# Patient Record
Sex: Female | Born: 1985 | Race: Black or African American | Hispanic: No | Marital: Single | Smoking: Former smoker
Health system: Southern US, Community
[De-identification: ages and names within clinical notes are randomized; demographics above are authoritative.]

## PROBLEM LIST (undated history)

## (undated) DIAGNOSIS — W3400XA Accidental discharge from unspecified firearms or gun, initial encounter: Secondary | ICD-10-CM

## (undated) HISTORY — PX: ABDOMINAL SURGERY: SHX537

---

## 2012-03-22 DIAGNOSIS — Z87828 Personal history of other (healed) physical injury and trauma: Secondary | ICD-10-CM

## 2012-03-22 HISTORY — DX: Personal history of other (healed) physical injury and trauma: Z87.828

## 2012-03-22 HISTORY — PX: EXPLORATORY LAPAROTOMY: SUR591

## 2015-12-03 ENCOUNTER — Emergency Department: Payer: Self-pay

## 2015-12-03 ENCOUNTER — Encounter: Payer: Self-pay | Admitting: Intensive Care

## 2015-12-03 ENCOUNTER — Emergency Department
Admission: EM | Admit: 2015-12-03 | Discharge: 2015-12-03 | Disposition: A | Discharge status: Home / Self Care | Payer: Self-pay | Attending: Student | Admitting: Student

## 2015-12-03 DIAGNOSIS — M25511 Pain in right shoulder: Secondary | ICD-10-CM | POA: Insufficient documentation

## 2015-12-03 DIAGNOSIS — Y929 Unspecified place or not applicable: Secondary | ICD-10-CM | POA: Insufficient documentation

## 2015-12-03 DIAGNOSIS — S161XXA Strain of muscle, fascia and tendon at neck level, initial encounter: Secondary | ICD-10-CM | POA: Insufficient documentation

## 2015-12-03 DIAGNOSIS — Y9389 Activity, other specified: Secondary | ICD-10-CM | POA: Insufficient documentation

## 2015-12-03 DIAGNOSIS — Y990 Civilian activity done for income or pay: Secondary | ICD-10-CM | POA: Insufficient documentation

## 2015-12-03 DIAGNOSIS — X58XXXA Exposure to other specified factors, initial encounter: Secondary | ICD-10-CM | POA: Insufficient documentation

## 2015-12-03 DIAGNOSIS — R0789 Other chest pain: Secondary | ICD-10-CM | POA: Insufficient documentation

## 2015-12-03 MED ORDER — METHOCARBAMOL 750 MG PO TABS: 750.0000 mg | ORAL_TABLET | Freq: Four times a day (QID) | ORAL | 0 refills | Status: DC

## 2015-12-03 MED ORDER — TRAMADOL HCL 50 MG PO TABS: 50.0000 mg | ORAL_TABLET | Freq: Four times a day (QID) | ORAL | 0 refills | Status: AC | PRN

## 2015-12-03 MED ORDER — NAPROXEN 500 MG PO TBEC: 500.0000 mg | DELAYED_RELEASE_TABLET | Freq: Two times a day (BID) | ORAL | 2 refills | Status: AC
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
## 2015-12-03 NOTE — ED Notes (Signed)
Patient's discharge and follow up information reviewed with patient by ED nursing staff and patient given the opportunity to ask questions pertaining to ED visit and discharge plan of care. Patient advised that should symptoms not continue to improve, resolve entirely, or should new symptoms develop then a follow up visit with their PCP or a return visit to the ED may be warranted. Patient verbalized consent and understanding of discharge plan of care including potential need for further evaluation. Patient discharged in stable condition per attending ED physician on duty.   Pt given info sheet for medication management clinic.
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
## 2016-10-07 ENCOUNTER — Encounter (HOSPITAL_COMMUNITY): Payer: Self-pay | Admitting: Emergency Medicine

## 2016-10-07 ENCOUNTER — Emergency Department (HOSPITAL_COMMUNITY)
Admission: EM | Admit: 2016-10-07 | Discharge: 2016-10-07 | Disposition: A | Discharge status: Home / Self Care | Payer: Self-pay | Attending: Emergency Medicine | Admitting: Emergency Medicine

## 2016-10-07 DIAGNOSIS — K0889 Other specified disorders of teeth and supporting structures: Secondary | ICD-10-CM

## 2016-10-07 DIAGNOSIS — S025XXA Fracture of tooth (traumatic), initial encounter for closed fracture: Secondary | ICD-10-CM | POA: Insufficient documentation

## 2016-10-07 DIAGNOSIS — Y939 Activity, unspecified: Secondary | ICD-10-CM | POA: Insufficient documentation

## 2016-10-07 DIAGNOSIS — X58XXXA Exposure to other specified factors, initial encounter: Secondary | ICD-10-CM | POA: Insufficient documentation

## 2016-10-07 DIAGNOSIS — Y929 Unspecified place or not applicable: Secondary | ICD-10-CM | POA: Insufficient documentation

## 2016-10-07 DIAGNOSIS — Z79899 Other long term (current) drug therapy: Secondary | ICD-10-CM | POA: Insufficient documentation

## 2016-10-07 DIAGNOSIS — Y999 Unspecified external cause status: Secondary | ICD-10-CM | POA: Insufficient documentation

## 2016-10-07 MED ORDER — PENICILLIN V POTASSIUM 500 MG PO TABS: 500.0000 mg | ORAL_TABLET | Freq: Three times a day (TID) | ORAL | 0 refills | Status: DC

## 2016-10-07 MED ORDER — HYDROCODONE-ACETAMINOPHEN 5-325 MG PO TABS: 1.0000 | ORAL_TABLET | ORAL | 0 refills | Status: DC | PRN

## 2016-10-07 MED ORDER — HYDROCODONE-ACETAMINOPHEN 5-325 MG PO TABS
1.0000 | ORAL_TABLET | Freq: Once | ORAL | Status: AC
  Administered 2016-10-07: 1 via ORAL
  Filled 2016-10-07: qty 1

## 2016-10-07 MED ORDER — PENICILLIN V POTASSIUM 500 MG PO TABS
500.0000 mg | ORAL_TABLET | Freq: Once | ORAL | Status: AC
  Administered 2016-10-07: 500 mg via ORAL
  Filled 2016-10-07: qty 1

## 2016-10-07 MED ORDER — BUPIVACAINE-EPINEPHRINE (PF) 0.5% -1:200000 IJ SOLN
1.8000 mL | Freq: Once | INTRAMUSCULAR | Status: AC
  Administered 2016-10-07: 1.8 mL
  Filled 2016-10-07: qty 1.8

## 2016-10-07 NOTE — ED Notes (Signed)
Pt states that she has been having pain to right lower tooth x 2 days and has progressively gotten worse. Molar appears broke. Pt given heating pad for comfort.

## 2016-10-07 NOTE — ED Triage Notes (Signed)
Pt from home with c/o lower right dental pain x 3 days. Pt denies fever or chills. Pt states she has not made an appointment with her dentist, but intends to. Pt states pain got increasingly worse this evening.

## 2016-10-07 NOTE — Discharge Instructions (Signed)
Follow up with your dentist for further management of dental pain. °
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
## 2017-04-14 ENCOUNTER — Emergency Department (HOSPITAL_COMMUNITY): Payer: Self-pay

## 2017-04-14 ENCOUNTER — Encounter (HOSPITAL_COMMUNITY): Payer: Self-pay | Admitting: Emergency Medicine

## 2017-04-14 ENCOUNTER — Other Ambulatory Visit: Payer: Self-pay

## 2017-04-14 ENCOUNTER — Emergency Department (HOSPITAL_COMMUNITY)
Admission: EM | Admit: 2017-04-14 | Discharge: 2017-04-14 | Disposition: A | Discharge status: Home / Self Care | Payer: Self-pay | Attending: Emergency Medicine | Admitting: Emergency Medicine

## 2017-04-14 DIAGNOSIS — R110 Nausea: Secondary | ICD-10-CM | POA: Insufficient documentation

## 2017-04-14 DIAGNOSIS — B9789 Other viral agents as the cause of diseases classified elsewhere: Secondary | ICD-10-CM | POA: Insufficient documentation

## 2017-04-14 DIAGNOSIS — J069 Acute upper respiratory infection, unspecified: Secondary | ICD-10-CM

## 2017-04-14 HISTORY — DX: Accidental discharge from unspecified firearms or gun, initial encounter: W34.00XA

## 2017-04-14 MED ORDER — BENZONATATE 100 MG PO CAPS: 200.0000 mg | ORAL_CAPSULE | Freq: Three times a day (TID) | ORAL | 0 refills | Status: DC

## 2017-04-14 MED ORDER — FLUTICASONE PROPIONATE 50 MCG/ACT NA SUSP: 1.0000 | Freq: Every day | NASAL | 2 refills | Status: DC

## 2017-04-14 MED ORDER — ONDANSETRON 4 MG PO TBDP: 4.0000 mg | ORAL_TABLET | Freq: Three times a day (TID) | ORAL | 0 refills | Status: DC | PRN
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
## 2017-04-14 NOTE — Discharge Instructions (Signed)
Please read attached information regarding your condition. Take Tessalon Perles as needed for cough. Take Zofran as needed for nausea. Use Flonase as needed for nasal congestion. Take Tylenol as needed for body aches. Return to ED for worsening symptoms, chest pain, trouble breathing, wheezing, increase productive cough or trauma to the chest.

## 2017-06-14 ENCOUNTER — Emergency Department (HOSPITAL_COMMUNITY)
Admission: EM | Admit: 2017-06-14 | Discharge: 2017-06-15 | Disposition: A | Discharge status: Home / Self Care | Payer: Self-pay | Attending: Emergency Medicine | Admitting: Emergency Medicine

## 2017-06-14 ENCOUNTER — Encounter (HOSPITAL_COMMUNITY): Payer: Self-pay

## 2017-06-14 ENCOUNTER — Emergency Department (HOSPITAL_COMMUNITY): Payer: Self-pay

## 2017-06-14 ENCOUNTER — Other Ambulatory Visit: Payer: Self-pay

## 2017-06-14 DIAGNOSIS — R112 Nausea with vomiting, unspecified: Secondary | ICD-10-CM | POA: Insufficient documentation

## 2017-06-14 DIAGNOSIS — R197 Diarrhea, unspecified: Secondary | ICD-10-CM | POA: Insufficient documentation

## 2017-06-14 DIAGNOSIS — R1084 Generalized abdominal pain: Secondary | ICD-10-CM | POA: Insufficient documentation

## 2017-06-14 LAB — COMPREHENSIVE METABOLIC PANEL
ALT: 20 U/L (ref 14–54)
ANION GAP: 9 (ref 5–15)
AST: 21 U/L (ref 15–41)
Albumin: 4.5 g/dL (ref 3.5–5.0)
Alkaline Phosphatase: 40 U/L (ref 38–126)
BUN: 11 mg/dL (ref 6–20)
CHLORIDE: 106 mmol/L (ref 101–111)
CO2: 24 mmol/L (ref 22–32)
Calcium: 9.4 mg/dL (ref 8.9–10.3)
Creatinine, Ser: 0.73 mg/dL (ref 0.44–1.00)
GFR calc Af Amer: >60 mL/min (ref >60)
GFR calc non Af Amer: >60 mL/min (ref >60)
Glucose, Bld: 93 mg/dL (ref 65–99)
POTASSIUM: 3.8 mmol/L (ref 3.5–5.1)
SODIUM: 139 mmol/L (ref 135–145)
Total Bilirubin: 1.3 mg/dL — ABNORMAL HIGH (ref 0.3–1.2)
Total Protein: 8.2 g/dL — ABNORMAL HIGH (ref 6.5–8.1)

## 2017-06-14 LAB — URINALYSIS, ROUTINE W REFLEX MICROSCOPIC
BILIRUBIN URINE: NEGATIVE
Bacteria, UA: NONE SEEN
Glucose, UA: NEGATIVE mg/dL
HGB URINE DIPSTICK: NEGATIVE
KETONES UR: 80 mg/dL — AB
LEUKOCYTES UA: NEGATIVE
Nitrite: NEGATIVE
PH: 5 (ref 5.0–8.0)
Protein, ur: 30 mg/dL — AB
Specific Gravity, Urine: 1.031 — ABNORMAL HIGH (ref 1.005–1.030)

## 2017-06-14 LAB — CBC
HCT: 43.4 % (ref 36.0–46.0)
HEMOGLOBIN: 14.8 g/dL (ref 12.0–15.0)
MCH: 31.3 pg (ref 26.0–34.0)
MCHC: 34.1 g/dL (ref 30.0–36.0)
MCV: 91.8 fL (ref 78.0–100.0)
Platelets: 314 10*3/uL (ref 150–400)
RBC: 4.73 MIL/uL (ref 3.87–5.11)
RDW: 12.6 % (ref 11.5–15.5)
WBC: 12.6 10*3/uL — AB (ref 4.0–10.5)

## 2017-06-14 LAB — LIPASE, BLOOD: LIPASE: 27 U/L (ref 11–51)

## 2017-06-14 LAB — I-STAT BETA HCG BLOOD, ED (MC, WL, AP ONLY)

## 2017-06-14 MED ORDER — SODIUM CHLORIDE 0.9 % IV BOLUS
1000.0000 mL | Freq: Once | INTRAVENOUS | Status: AC
  Administered 2017-06-14: 1000 mL via INTRAVENOUS

## 2017-06-14 MED ORDER — MORPHINE SULFATE (PF) 4 MG/ML IV SOLN
4.0000 mg | Freq: Once | INTRAVENOUS | Status: AC
  Administered 2017-06-14: 4 mg via INTRAVENOUS
  Filled 2017-06-14: qty 1

## 2017-06-14 MED ORDER — ONDANSETRON 4 MG PO TBDP
4.0000 mg | ORAL_TABLET | Freq: Once | ORAL | Status: AC | PRN
  Administered 2017-06-14: 4 mg via ORAL
  Filled 2017-06-14: qty 1

## 2017-06-14 MED ORDER — IOPAMIDOL (ISOVUE-300) INJECTION 61%
INTRAVENOUS | Status: AC
  Administered 2017-06-14: 100 mL
  Filled 2017-06-14: qty 100

## 2017-06-14 MED ORDER — ONDANSETRON 4 MG PO TBDP: 4.0000 mg | ORAL_TABLET | Freq: Three times a day (TID) | ORAL | 0 refills | Status: DC | PRN

## 2017-06-14 NOTE — ED Provider Notes (Signed)
Received patient transferred care from Harolyn RutherfordShawn Joy, PA-C  Pending CT abdomen and pelvis r/o appy. Patient was discharged by shawn after Ct results and confirmed improvement in symptoms.   Georgiana ShoreMitchell, Zaley Talley B, PA-C 06/15/17 Salley [id number]    Shaune PollackIsaacs, Cameron, MD 06/15/17 1210

## 2017-06-14 NOTE — ED Triage Notes (Signed)
Pt c/o emesis, diarrhea starting last night. Also c/o sharp intermittent abd pain that is relieved somewhat after throwing up, but comes back. Speakign in full sentences in triage.

## 2017-06-14 NOTE — ED Notes (Signed)
Patient transported to CT 

## 2017-06-14 NOTE — Discharge Instructions (Addendum)
Your symptoms are consistent with a viral illness. Viruses do not require antibiotics. Treatment is symptomatic care.   Hand washing: Wash your hands throughout the day, but especially before and after touching the face, using the restroom, sneezing, coughing, or touching surfaces that have been coughed or sneezed upon. Hydration: Symptoms will be intensified and complicated by dehydration. Dehydration can also extend the duration of symptoms. Drink plenty of fluids and get plenty of rest. You should be drinking at least half a liter of water an hour to stay hydrated. Electrolyte drinks (ex. Gatorade, Powerade, Pedialyte) are also encouraged. You should be drinking enough fluids to make your urine light yellow, almost clear. If this is not the case, you are not drinking enough water. Please note that some of the treatments indicated below will not be effective if you are not adequately hydrated. Diet: Please concentrate on hydration, however, you may introduce food slowly.  Start with a clear liquid diet, progressed to a full liquid diet, and then bland solids as you are able. Pain or fever: Ibuprofen, Naproxen, or Tylenol for pain or fever.  Nausea/vomiting: Use the Zofran for nausea or vomiting.  Follow up: Follow up with a primary care provider, as needed, for any future management of this issue. Return: Return to the ED for worsening pain, persistent fever over 100.3 F, persistent vomiting, or any other major concerns.
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
## 2017-07-15 ENCOUNTER — Encounter (HOSPITAL_COMMUNITY): Payer: Self-pay | Admitting: *Deleted

## 2017-07-15 ENCOUNTER — Emergency Department (HOSPITAL_COMMUNITY)
Admission: EM | Admit: 2017-07-15 | Discharge: 2017-07-15 | Disposition: A | Discharge status: Home / Self Care | Payer: Self-pay | Attending: Emergency Medicine | Admitting: Emergency Medicine

## 2017-07-15 DIAGNOSIS — J0110 Acute frontal sinusitis, unspecified: Secondary | ICD-10-CM | POA: Insufficient documentation

## 2017-07-15 DIAGNOSIS — R51 Headache: Secondary | ICD-10-CM | POA: Insufficient documentation

## 2017-07-15 DIAGNOSIS — H538 Other visual disturbances: Secondary | ICD-10-CM | POA: Insufficient documentation

## 2017-07-15 DIAGNOSIS — R21 Rash and other nonspecific skin eruption: Secondary | ICD-10-CM | POA: Insufficient documentation

## 2017-07-15 DIAGNOSIS — R6883 Chills (without fever): Secondary | ICD-10-CM | POA: Insufficient documentation

## 2017-07-15 MED ORDER — FLUTICASONE PROPIONATE 50 MCG/ACT NA SUSP: 2.0000 | Freq: Every day | NASAL | 0 refills | Status: DC

## 2017-07-15 MED ORDER — LORATADINE 10 MG PO TABS: 10.0000 mg | ORAL_TABLET | Freq: Every day | ORAL | 0 refills | Status: DC

## 2017-07-15 MED ORDER — OXYMETAZOLINE HCL 0.05 % NA SOLN: 1.0000 | Freq: Two times a day (BID) | NASAL | 0 refills | Status: AC

## 2017-07-15 NOTE — ED Triage Notes (Signed)
Pt noticed rash to left lower abdomen yesterday. Pt states she felt chills yesterday. Pt states the area initially burned. Pt denies itching or pain at this time. Pt has 6/10 pain in head.
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
## 2018-05-16 ENCOUNTER — Encounter (HOSPITAL_COMMUNITY): Payer: Self-pay | Admitting: Emergency Medicine

## 2018-05-16 ENCOUNTER — Emergency Department (HOSPITAL_COMMUNITY): Payer: No Typology Code available for payment source

## 2018-05-16 ENCOUNTER — Emergency Department (HOSPITAL_COMMUNITY)
Admission: EM | Admit: 2018-05-16 | Discharge: 2018-05-16 | Disposition: A | Discharge status: Home / Self Care | Payer: No Typology Code available for payment source | Attending: Emergency Medicine | Admitting: Emergency Medicine

## 2018-05-16 DIAGNOSIS — S161XXA Strain of muscle, fascia and tendon at neck level, initial encounter: Secondary | ICD-10-CM | POA: Diagnosis not present

## 2018-05-16 DIAGNOSIS — Y999 Unspecified external cause status: Secondary | ICD-10-CM | POA: Insufficient documentation

## 2018-05-16 DIAGNOSIS — Y9389 Activity, other specified: Secondary | ICD-10-CM | POA: Diagnosis not present

## 2018-05-16 DIAGNOSIS — Z79899 Other long term (current) drug therapy: Secondary | ICD-10-CM | POA: Diagnosis not present

## 2018-05-16 DIAGNOSIS — Y929 Unspecified place or not applicable: Secondary | ICD-10-CM | POA: Diagnosis not present

## 2018-05-16 DIAGNOSIS — S0990XA Unspecified injury of head, initial encounter: Secondary | ICD-10-CM | POA: Diagnosis present

## 2018-05-16 MED ORDER — IBUPROFEN 400 MG PO TABS
400.0000 mg | ORAL_TABLET | Freq: Once | ORAL | Status: AC
  Administered 2018-05-16: 400 mg via ORAL
  Filled 2018-05-16: qty 1

## 2018-05-16 MED ORDER — IBUPROFEN 600 MG PO TABS: 600.0000 mg | ORAL_TABLET | Freq: Four times a day (QID) | ORAL | 0 refills | Status: DC | PRN

## 2018-05-16 MED ORDER — METHOCARBAMOL 750 MG PO TABS: 750.0000 mg | ORAL_TABLET | Freq: Three times a day (TID) | ORAL | 0 refills | Status: DC | PRN

## 2018-05-16 MED ORDER — ACETAMINOPHEN 500 MG PO TABS
1000.0000 mg | ORAL_TABLET | Freq: Once | ORAL | Status: AC
  Administered 2018-05-16: 1000 mg via ORAL
  Filled 2018-05-16: qty 2
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
## 2018-05-20 ENCOUNTER — Ambulatory Visit (HOSPITAL_COMMUNITY)
Admission: EM | Admit: 2018-05-20 | Discharge: 2018-05-20 | Disposition: A | Discharge status: Home / Self Care | Payer: Self-pay | Attending: Family Medicine | Admitting: Family Medicine

## 2018-05-20 ENCOUNTER — Encounter (HOSPITAL_COMMUNITY): Payer: Self-pay

## 2018-05-20 ENCOUNTER — Other Ambulatory Visit: Payer: Self-pay

## 2018-05-20 DIAGNOSIS — S161XXA Strain of muscle, fascia and tendon at neck level, initial encounter: Secondary | ICD-10-CM
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
## 2019-01-15 ENCOUNTER — Emergency Department (HOSPITAL_COMMUNITY)
Admission: EM | Admit: 2019-01-15 | Discharge: 2019-01-15 | Disposition: A | Discharge status: Home / Self Care | Payer: Self-pay | Attending: Emergency Medicine | Admitting: Emergency Medicine

## 2019-01-15 ENCOUNTER — Encounter (HOSPITAL_COMMUNITY): Payer: Self-pay | Admitting: Emergency Medicine

## 2019-01-15 ENCOUNTER — Other Ambulatory Visit: Payer: Self-pay

## 2019-01-15 DIAGNOSIS — Z79899 Other long term (current) drug therapy: Secondary | ICD-10-CM | POA: Insufficient documentation

## 2019-01-15 DIAGNOSIS — F1210 Cannabis abuse, uncomplicated: Secondary | ICD-10-CM | POA: Insufficient documentation

## 2019-01-15 DIAGNOSIS — R21 Rash and other nonspecific skin eruption: Secondary | ICD-10-CM

## 2019-01-15 MED ORDER — HYDROXYZINE HCL 25 MG PO TABS: 25.0000 mg | ORAL_TABLET | Freq: Four times a day (QID) | ORAL | 0 refills | Status: DC | PRN

## 2019-01-15 MED ORDER — FAMOTIDINE 20 MG PO TABS: 20.0000 mg | ORAL_TABLET | Freq: Two times a day (BID) | ORAL | 0 refills | Status: DC

## 2019-01-15 NOTE — Discharge Instructions (Addendum)
You were seen in the emergency department today for a rash.  We are unsure what exactly is causing the rash, we are sending you home with Atarax to take every 6 hours as needed for rash/itching, this medication may make you sleepy so do not drive or operate heavy machinery when taking it.  Also sending you with Pepcid to help with itching/rash, please take this twice per day.  Please follow-up with dermatology within 5 days for reassessment.  If you do not have a dermatologist or primary care provider please see dermatology options listed below.  Return to the ER for new or worsening symptoms or any other concerns.  [facility] Dermatologists:   [facility]  3.2 [phone])  Dermatologist  [place] # [place]  [phone]   Dr. Michelene Gardener, MD  2.6 [phone])  Dermatologist  [place]  [phone]  [facility]  3.5 (3)  [facility]  [place]  [phone]   [place]  4.0 (4)  Dermatologist  [place]  [phone]  Lavonna Monarch MD  3.0 (2)  Dermatologist  [place]  [phone]  Katrina Stack  2.7 (6)  Dermatologist  [place]  [phone]  [place] Amy Y MD  2.0 (1)  Dermatologist  [place]  [phone]  [place]  5.0 (3)  Doctor  [address]  [phone]

## 2019-01-15 NOTE — ED Triage Notes (Signed)
Ongoing generalized rash for 2 weeks; unknown cause; pt states epsom salts helped over the weekend.
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
## 2020-10-02 ENCOUNTER — Emergency Department (HOSPITAL_BASED_OUTPATIENT_CLINIC_OR_DEPARTMENT_OTHER)
Admission: EM | Admit: 2020-10-02 | Discharge: 2020-10-02 | Disposition: A | Discharge status: Home / Self Care | Payer: Self-pay | Attending: Emergency Medicine | Admitting: Emergency Medicine

## 2020-10-02 ENCOUNTER — Encounter (HOSPITAL_BASED_OUTPATIENT_CLINIC_OR_DEPARTMENT_OTHER): Payer: Self-pay | Admitting: *Deleted

## 2020-10-02 ENCOUNTER — Emergency Department (HOSPITAL_BASED_OUTPATIENT_CLINIC_OR_DEPARTMENT_OTHER): Payer: Self-pay

## 2020-10-02 ENCOUNTER — Other Ambulatory Visit: Payer: Self-pay

## 2020-10-02 DIAGNOSIS — S161XXA Strain of muscle, fascia and tendon at neck level, initial encounter: Secondary | ICD-10-CM | POA: Insufficient documentation

## 2020-10-02 DIAGNOSIS — W2105XA Struck by basketball, initial encounter: Secondary | ICD-10-CM | POA: Insufficient documentation

## 2020-10-02 DIAGNOSIS — S060X0A Concussion without loss of consciousness, initial encounter: Secondary | ICD-10-CM | POA: Insufficient documentation

## 2020-10-02 DIAGNOSIS — Y9367 Activity, basketball: Secondary | ICD-10-CM | POA: Insufficient documentation

## 2020-10-02 MED ORDER — IBUPROFEN 400 MG PO TABS
600.0000 mg | ORAL_TABLET | Freq: Once | ORAL | Status: DC
  Filled 2020-10-02: qty 1

## 2020-10-02 NOTE — ED Notes (Signed)
Patient returned from CT at this time.
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
## 2020-10-02 NOTE — Discharge Instructions (Addendum)
If you develop new or worsening headache, vomiting, vision changes, neck pain or stiffness, weakness or numbness in extremities, or any other new/concerning symptoms the return to the ER for evaluation.

## 2020-10-02 NOTE — ED Notes (Signed)
This RN presented the AVS utilizing Teachback Method. Patient verbalizes understanding of Discharge Instructions. Opportunity for Questioning and Answers were provided. Patient Discharged from ED ambulatory to Home with Mother.   

## 2020-10-02 NOTE — ED Notes (Signed)
Patient transported to Radiology at this time.

## 2021-04-07 ENCOUNTER — Other Ambulatory Visit: Payer: Self-pay

## 2021-04-07 ENCOUNTER — Encounter (HOSPITAL_COMMUNITY): Payer: Self-pay | Admitting: Emergency Medicine

## 2021-04-07 ENCOUNTER — Emergency Department (HOSPITAL_COMMUNITY)
Admission: EM | Admit: 2021-04-07 | Discharge: 2021-04-07 | Disposition: A | Discharge status: Home / Self Care | Payer: Self-pay | Attending: Emergency Medicine | Admitting: Emergency Medicine

## 2021-04-07 ENCOUNTER — Emergency Department (HOSPITAL_COMMUNITY): Payer: Self-pay

## 2021-04-07 DIAGNOSIS — R0981 Nasal congestion: Secondary | ICD-10-CM

## 2021-04-07 DIAGNOSIS — U071 COVID-19: Secondary | ICD-10-CM | POA: Insufficient documentation

## 2021-04-07 LAB — RESP PANEL BY RT-PCR (FLU A&B, COVID) ARPGX2
Influenza A by PCR: NEGATIVE
Influenza B by PCR: NEGATIVE
SARS Coronavirus 2 by RT PCR: POSITIVE — AB

## 2021-04-07 MED ORDER — ONDANSETRON 4 MG PO TBDP: ORAL_TABLET | ORAL | 0 refills | Status: DC

## 2021-04-07 MED ORDER — CETIRIZINE HCL 10 MG PO TABS: 10.0000 mg | ORAL_TABLET | Freq: Every day | ORAL | 0 refills | Status: DC

## 2021-04-07 MED ORDER — FLUTICASONE PROPIONATE 50 MCG/ACT NA SUSP: 2.0000 | Freq: Every day | NASAL | 0 refills | Status: DC

## 2021-04-07 NOTE — Discharge Instructions (Addendum)
You have tested positive for COVID-19 virus.  Please continue to quarantine at home and monitor your symptoms closely. You chest x-ray was clear. Antibiotics are not helpful in treating viral infection, the virus should run its course in about 7 days. Please make sure you are drinking plenty of fluids. You can treat your symptoms supportively with tylenol  and ibuprofen for fevers and pains, and over the counter cough syrups and throat lozenges to help with cough. If your symptoms are not improving please follow up with you Primary doctor.   Some of your ongoing nasal congestion and ear pressure is likely related to allergic rhinitis.  Flonase and Zyrtec will help with this, take daily, in the short-term you can also use over-the-counter congestions like Sudafed to help relieve congestion and ear pressure.  I recommend that you purchase a home pulse ox to help better monitor your oxygen at home, if you start to have increased work of breathing or shortness of breath or your oxygen drops below 90% please immediately return to the hospital for reevaluation.  If you develop persistent fevers, shortness of breath or difficulty breathing, chest pain, severe headache and neck pain, persistent nausea and vomiting or other new or concerning symptoms return to the Emergency department.

## 2021-04-07 NOTE — ED Provider Triage Note (Signed)
Emergency Medicine Provider Triage Evaluation Note  Dana Alexander , a 35 y.o. female  was evaluated in triage.  Pt complains of right ear pain times a month and a half.  Patient states that she has had worsening nasal congestion and bilateral ear pain since she moved into her new Apt[address] 3 months ago.  She is concerned about mold in her apartment, as well as an insect infestation.  She is worried that something may have crawled in her right ear.  For the past 4 days she also says that she has been more congested, with a productive cough, no fever over the weekend of 102F.  Review of Systems  Positive: Fever, chills, otalgia, nasal congestion, cough, headache Negative: Dizziness, syncope  Physical Exam  BP 118/82 (BP Location: Left Arm)    Pulse 61    Temp 98.1 F (36.7 C) (Oral)    Resp 17    SpO2 99%  Gen:   Awake, no distress   Resp:  Normal effort  MSK:   Moves extremities without difficulty  Other:    Medical Decision Making  Medically screening exam initiated at 1:30 PM.  Appropriate orders placed.  Dana Alexander was informed that the remainder of the evaluation will be completed by another provider, this initial triage assessment does not replace that evaluation, and the importance of remaining in the ED until their evaluation is complete.     Korben Carcione T, PA-C 04/07/21 1331

## 2021-04-07 NOTE — ED Provider Notes (Signed)
[facility] EMERGENCY DEPARTMENT Provider Note   CSN: [id number] Arrival date & time: 04/07/21  1217     History  Chief Complaint  Patient presents with   Otalgia   Fever   Nasal Congestion   Cough    Dana Alexander is a 36 y.o. female.  Dana Alexander is a 36 y.o. female who is otherwise healthy, who presents to the ED for congestion, rhinorrhea, cough and otalgia.  Patient reports that for about a month and a half she has been experiencing congestion and ear pain.  She reports this started not long after she moved into a new apartment and she is concerned there may be mold in her apartment which it is currently being tested for.  She reports over the past few days though she started to feel worse and started to have an increasing cough that is now productive.  Has had some intermittent subjective fevers.  She is also been having headaches and body aches.  Over the past 2 days she has also had some nausea, vomiting and diarrhea.  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  She has tried some natural supplements for her symptoms without much improvement, has not tried anything else to treat symptoms.  The history is provided by the patient and medical records.      Home Medications Prior to Admission medications   Medication Sig Start Date End Date Taking? Authorizing Provider  cetirizine (ZYRTEC ALLERGY) 10 MG tablet Take 1 tablet (10 mg total) by mouth daily. 04/07/21  Yes Dartha Lodge, PA-C  fluticasone (FLONASE) 50 MCG/ACT nasal spray Place 2 sprays into both nostrils daily. 04/07/21  Yes Dartha Lodge, PA-C  ondansetron (ZOFRAN-ODT) 4 MG disintegrating tablet 4mg  ODT q4 hours prn nausea/vomit 04/07/21  Yes 04/09/21, PA-C      Allergies    Patient has no known allergies.    Review of Systems   Review of Systems  Constitutional:  Positive for chills and fever.  HENT:  Positive for congestion, ear  pain, rhinorrhea and sore throat.   Respiratory:  Positive for cough. Negative for shortness of breath.   Cardiovascular:  Negative for chest pain.  Gastrointestinal:  Positive for diarrhea, nausea and vomiting. Negative for abdominal pain.  Musculoskeletal:  Positive for myalgias. Negative for neck pain and neck stiffness.  Skin:  Negative for color change and rash.  Neurological:  Positive for headaches.  All other systems reviewed and are negative.  Physical Exam Updated Vital Signs BP 125/70 (BP Location: Right Arm)    Pulse 65    Temp 98.3 F (36.8 C) (Oral)    Resp 18    LMP 03/17/2021    SpO2 99%  Physical Exam Vitals and nursing note reviewed.  Constitutional:      General: She is not in acute distress.    Appearance: She is well-developed. She is not ill-appearing or diaphoretic.  HENT:     Head: Normocephalic and atraumatic.     Right Ear: Tympanic membrane and ear canal normal.     Left Ear: Tympanic membrane and ear canal normal.     Ears:     Comments: Bilateral TMs are clear with no erythema, bulging or effusion    Nose: Congestion and rhinorrhea present.     Mouth/Throat:     Mouth: Mucous membranes are moist.     Pharynx: Oropharynx is clear. No oropharyngeal exudate  or posterior oropharyngeal erythema.  Eyes:     General:        Right eye: No discharge.        Left eye: No discharge.  Neck:     Comments: No rigidity Cardiovascular:     Rate and Rhythm: Normal rate and regular rhythm.     Heart sounds: Normal heart sounds. No murmur heard.   No friction rub. No gallop.  Pulmonary:     Effort: Pulmonary effort is normal. No respiratory distress.     Breath sounds: Normal breath sounds.     Comments: Respirations equal and unlabored, patient able to speak in full sentences, lungs clear to auscultation bilaterally  Abdominal:     General: Bowel sounds are normal. There is no distension.     Palpations: Abdomen is soft. There is no mass.     Tenderness: There  is no abdominal tenderness. There is no guarding.     Comments: Abdomen soft, nondistended, nontender to palpation in all quadrants without guarding or peritoneal signs  Musculoskeletal:        General: No deformity.     Cervical back: Neck supple.  Lymphadenopathy:     Cervical: No cervical adenopathy.  Skin:    General: Skin is warm and dry.     Capillary Refill: Capillary refill takes less than 2 seconds.  Neurological:     Mental Status: She is alert and oriented to person, place, and time.  Psychiatric:        Mood and Affect: Mood normal.        Behavior: Behavior normal.    ED Results / Procedures / Treatments   Labs (all labs ordered are listed, but only abnormal results are displayed) Labs Reviewed  RESP PANEL BY RT-PCR (FLU A&B, COVID) ARPGX2 - Abnormal; Notable for the following components:      Result Value   SARS Coronavirus 2 by RT PCR POSITIVE (*)    All other components within normal limits    EKG None  Radiology DG Chest 2 View  Result Date: 04/07/2021 CLINICAL DATA:  Cough, otalgia for 1 month, fever EXAM: CHEST - 2 VIEW COMPARISON:  04/14/2017 FINDINGS: Frontal and lateral views of the chest demonstrate an unremarkable cardiac silhouette. No acute airspace disease, effusion, or pneumothorax. There are no acute bony abnormalities. IMPRESSION: 1. No acute intrathoracic process. Electronically Signed   By: Sharlet SalinaMichael  Brown M.D.   On: 04/07/2021 15:46    Procedures Procedures    Medications Ordered in ED Medications - No data to display  ED Course/ Medical Decision Making/ A&P                            36 year old female presents to the emergency department for cough, congestion, otalgia.  His symptoms have been ongoing for about a month and a half but over the past few days she started to feel a lot worse with body aches, subjective fevers and vomiting and diarrhea.  Patient had recently moved into a new apartment was concern for potential mold exposure  and is currently having her home tested.  On arrival she has normal vitals and is well-appearing.  Lungs are clear.  Despite nausea, vomiting and diarrhea she has no focal abdominal tenderness.  Differential includes: Allergic rhinitis, viral URI, COVID, flu, pneumonia, viral gastroenteritis  Respiratory viral testing ordered, I have personally reviewed and interpreted results and patient has tested positive for COVID, negative for  influenza.  Do not feel that additional lab work is indicated or would change management at this time given that patient has normal vitals and is well-appearing.  I have independently reviewed chest x-ray which shows no active cardiopulmonary disease, agree with radiologist findings.  I suspect patient likely had underlying allergic rhinitis potentially due to mold over the past month and a half but then developed COVID in the last few days contributing to her worsening symptoms.  Recommend starting Zyrtec and Flonase to help with allergic rhinitis, suspect that this congestion has led to her ear pain as she has no signs of otitis media or externa on exam.  Does not have sinus tenderness on exam, low suspicion for bacterial sinusitis.  Suspect cough, as well as nausea and vomiting are in the setting of COVID infection that likely started over the weekend.  Given that patient is otherwise healthy without risk factors for complication considered antiviral therapy and discussed this with patient, but will hold off on these medications at this time after shared decision-making discussion.  We will continue with supportive treatment.  Discussed return precautions and outpatient follow-up.  At this time do not feel the patient meets criteria for admission and is appropriate for discharge home with continued supportive care.  Work note provided and discussed Producer, television/film/video.        Final Clinical Impression(s) / ED Diagnoses Final diagnoses:  COVID-19 virus infection   Nasal congestion    Rx / DC Orders ED Discharge Orders          Ordered    fluticasone (FLONASE) 50 MCG/ACT nasal spray  Daily        04/07/21 1625    cetirizine (ZYRTEC ALLERGY) 10 MG tablet  Daily        04/07/21 1625    ondansetron (ZOFRAN-ODT) 4 MG disintegrating tablet        04/07/21 1625              Dartha Lodge, [place] 04/08/21 1438    Edwin Dada P, DO 04/10/21 2355

## 2021-04-07 NOTE — ED Triage Notes (Signed)
Pt. Stated, Dana Alexander had a ear problem for a month half .  It feels like there is something in there. I also have congestion, cough

## 2021-04-07 NOTE — ED Notes (Signed)
Pt verbalized understanding of d/c instructions, meds and followup care. Denies questions. VSS, no distress noted. Steady gait to exit with all belongings.  ?

## 2022-05-07 DIAGNOSIS — M542 Cervicalgia: Secondary | ICD-10-CM | POA: Diagnosis not present

## 2022-05-07 DIAGNOSIS — M62838 Other muscle spasm: Secondary | ICD-10-CM | POA: Diagnosis not present

## 2022-05-07 DIAGNOSIS — M5412 Radiculopathy, cervical region: Secondary | ICD-10-CM | POA: Diagnosis not present

## 2022-05-20 DIAGNOSIS — M5412 Radiculopathy, cervical region: Secondary | ICD-10-CM | POA: Diagnosis not present

## 2022-10-13 ENCOUNTER — Ambulatory Visit: Payer: 59 | Admitting: Internal Medicine

## 2022-10-13 ENCOUNTER — Encounter: Payer: Self-pay | Admitting: Internal Medicine

## 2022-10-13 VITALS — BP 120/76 | HR 74 | Temp 98.3°F | Ht 68.0 in | Wt 137.0 lb

## 2022-10-13 DIAGNOSIS — R202 Paresthesia of skin: Secondary | ICD-10-CM | POA: Diagnosis not present

## 2022-10-13 DIAGNOSIS — L659 Nonscarring hair loss, unspecified: Secondary | ICD-10-CM | POA: Diagnosis not present

## 2022-10-13 LAB — CBC WITH DIFFERENTIAL/PLATELET
Basophils Absolute: 0 10*3/uL (ref 0.0–0.1)
Basophils Relative: 1.2 % (ref 0.0–3.0)
Eosinophils Absolute: 0.1 10*3/uL (ref 0.0–0.7)
Eosinophils Relative: 1.8 % (ref 0.0–5.0)
HCT: 40.9 % (ref 36.0–46.0)
Hemoglobin: 13.4 g/dL (ref 12.0–15.0)
Lymphocytes Relative: 32.6 % (ref 12.0–46.0)
Lymphs Abs: 1.3 10*3/uL (ref 0.7–4.0)
MCHC: 32.8 g/dL (ref 30.0–36.0)
MCV: 96.5 fl (ref 78.0–100.0)
Monocytes Absolute: 0.5 10*3/uL (ref 0.1–1.0)
Monocytes Relative: 12.4 % — ABNORMAL HIGH (ref 3.0–12.0)
Neutro Abs: 2 10*3/uL (ref 1.4–7.7)
Neutrophils Relative %: 52 % (ref 43.0–77.0)
Platelets: 219 10*3/uL (ref 150.0–400.0)
RBC: 4.23 Mil/uL (ref 3.87–5.11)
RDW: 13 % (ref 11.5–15.5)
WBC: 3.9 10*3/uL — ABNORMAL LOW (ref 4.0–10.5)

## 2022-10-13 LAB — BASIC METABOLIC PANEL
BUN: 8 mg/dL (ref 6–23)
CO2: 24 mEq/L (ref 19–32)
Calcium: 9.3 mg/dL (ref 8.4–10.5)
Chloride: 106 mEq/L (ref 96–112)
Creatinine, Ser: 0.77 mg/dL (ref 0.40–1.20)
GFR: 100.03 mL/min (ref >60.00)
Glucose, Bld: 88 mg/dL (ref 70–99)
Potassium: 3.9 mEq/L (ref 3.5–5.1)
Sodium: 138 mEq/L (ref 135–145)

## 2022-10-13 LAB — VITAMIN D 25 HYDROXY (VIT D DEFICIENCY, FRACTURES): VITD: 17.77 ng/mL — ABNORMAL LOW (ref 30.00–100.00)

## 2022-10-13 LAB — VITAMIN B12: Vitamin B-12: 701 pg/mL (ref 211–911)

## 2022-10-13 NOTE — Patient Instructions (Signed)
Go to lab for bloodwork today.
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
## 2022-10-14 LAB — IRON,TIBC AND FERRITIN PANEL
Ferritin: 24 ng/mL (ref 16–154)
Iron: 145 ug/dL (ref 40–190)
TIBC: 268 mcg/dL (calc) (ref 250–450)

## 2022-10-15 ENCOUNTER — Other Ambulatory Visit: Payer: Self-pay | Admitting: Internal Medicine

## 2022-10-15 DIAGNOSIS — E559 Vitamin D deficiency, unspecified: Secondary | ICD-10-CM | POA: Insufficient documentation

## 2022-10-15 MED ORDER — VITAMIN D3 1.25 MG (50000 UT) PO CAPS: 1.0000 | ORAL_CAPSULE | ORAL | 0 refills | Status: AC
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
## 2023-02-22 ENCOUNTER — Encounter: Payer: 59 | Admitting: Internal Medicine

## 2023-02-28 ENCOUNTER — Encounter: Payer: Self-pay | Admitting: Internal Medicine

## 2023-02-28 ENCOUNTER — Ambulatory Visit (INDEPENDENT_AMBULATORY_CARE_PROVIDER_SITE_OTHER): Payer: 59 | Admitting: Internal Medicine

## 2023-02-28 ENCOUNTER — Other Ambulatory Visit (HOSPITAL_COMMUNITY)
Admission: RE | Admit: 2023-02-28 | Discharge: 2023-02-28 | Disposition: A | Discharge status: Home / Self Care | Payer: 59 | Source: Ambulatory Visit | Attending: Internal Medicine | Admitting: Internal Medicine

## 2023-02-28 VITALS — BP 118/80 | HR 72 | Temp 98.3°F | Ht 68.0 in | Wt 138.6 lb

## 2023-02-28 DIAGNOSIS — Z124 Encounter for screening for malignant neoplasm of cervix: Secondary | ICD-10-CM | POA: Insufficient documentation

## 2023-02-28 DIAGNOSIS — K921 Melena: Secondary | ICD-10-CM

## 2023-02-28 DIAGNOSIS — R1032 Left lower quadrant pain: Secondary | ICD-10-CM

## 2023-02-28 DIAGNOSIS — W57XXXA Bitten or stung by nonvenomous insect and other nonvenomous arthropods, initial encounter: Secondary | ICD-10-CM

## 2023-02-28 DIAGNOSIS — Z0000 Encounter for general adult medical examination without abnormal findings: Secondary | ICD-10-CM

## 2023-02-28 DIAGNOSIS — S90466A Insect bite (nonvenomous), unspecified lesser toe(s), initial encounter: Secondary | ICD-10-CM

## 2023-02-28 DIAGNOSIS — Z0001 Encounter for general adult medical examination with abnormal findings: Secondary | ICD-10-CM | POA: Diagnosis not present

## 2023-02-28 DIAGNOSIS — E559 Vitamin D deficiency, unspecified: Secondary | ICD-10-CM

## 2023-02-28 LAB — POC URINALSYSI DIPSTICK (AUTOMATED)
Bilirubin, UA: NEGATIVE
Glucose, UA: NEGATIVE
Ketones, UA: NEGATIVE
Leukocytes, UA: NEGATIVE
Nitrite, UA: NEGATIVE
Protein, UA: POSITIVE
Spec Grav, UA: >=1.03 (ref 1.010–1.025)
Urobilinogen, UA: 0.2 U/dL
pH, UA: 6 (ref 5.0–8.0)

## 2023-02-28 LAB — HEMOCCULT GUIAC POC 1CARD (OFFICE): Fecal Occult Blood, POC: POSITIVE — AB

## 2023-02-28 LAB — POCT PREGNANCY, URINE
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
## 2023-02-28 NOTE — Patient Instructions (Signed)
Insect bite: - Cortisone cream  - Benadryl tablets  Abdominal pain:  - lab work  - Ct scan of abdomen to rule out diverticulitis or other acute causes   Cervical Cancer screening:  - pap smear done today , usually takes 1-2 weeks for results. I will notify you what it says.   Annual physical: - lab work done today

## 2023-03-01 LAB — COMPREHENSIVE METABOLIC PANEL
ALT: 12 U/L (ref 0–35)
AST: 21 U/L (ref 0–37)
Albumin: 4.1 g/dL (ref 3.5–5.2)
Alkaline Phosphatase: 36 U/L — ABNORMAL LOW (ref 39–117)
BUN: 9 mg/dL (ref 6–23)
CO2: 27 meq/L (ref 19–32)
Calcium: 9.1 mg/dL (ref 8.4–10.5)
Chloride: 105 meq/L (ref 96–112)
Creatinine, Ser: 0.72 mg/dL (ref 0.40–1.20)
GFR: 108.13 mL/min (ref >60.00)
Glucose, Bld: 89 mg/dL (ref 70–99)
Potassium: 3.8 meq/L (ref 3.5–5.1)
Sodium: 139 meq/L (ref 135–145)
Total Bilirubin: 0.8 mg/dL (ref 0.2–1.2)
Total Protein: 7.3 g/dL (ref 6.0–8.3)

## 2023-03-01 LAB — LIPID PANEL
Cholesterol: 140 mg/dL (ref 0–200)
HDL: 71.1 mg/dL (ref >39.00)
LDL Cholesterol: 61 mg/dL (ref 0–99)
NonHDL: 69.07
Total CHOL/HDL Ratio: 2
Triglycerides: 42 mg/dL (ref 0.0–149.0)
VLDL: 8.4 mg/dL (ref 0.0–40.0)

## 2023-03-01 LAB — CBC WITH DIFFERENTIAL/PLATELET
Basophils Absolute: 0 10*3/uL (ref 0.0–0.1)
Basophils Relative: 1.2 % (ref 0.0–3.0)
Eosinophils Absolute: 0.1 10*3/uL (ref 0.0–0.7)
Eosinophils Relative: 1.4 % (ref 0.0–5.0)
HCT: 38.7 % (ref 36.0–46.0)
Hemoglobin: 12.9 g/dL (ref 12.0–15.0)
Lymphocytes Relative: 30.7 % (ref 12.0–46.0)
Lymphs Abs: 1.2 10*3/uL (ref 0.7–4.0)
MCHC: 33.2 g/dL (ref 30.0–36.0)
MCV: 96.1 fL (ref 78.0–100.0)
Monocytes Absolute: 0.4 10*3/uL (ref 0.1–1.0)
Monocytes Relative: 11.6 % (ref 3.0–12.0)
Neutro Abs: 2.1 10*3/uL (ref 1.4–7.7)
Neutrophils Relative %: 55.1 % (ref 43.0–77.0)
Platelets: 228 10*3/uL (ref 150.0–400.0)
RBC: 4.03 Mil/uL (ref 3.87–5.11)
RDW: 12.9 % (ref 11.5–15.5)
WBC: 3.8 10*3/uL — ABNORMAL LOW (ref 4.0–10.5)

## 2023-03-01 LAB — VITAMIN D 25 HYDROXY (VIT D DEFICIENCY, FRACTURES): VITD: 46.72 ng/mL (ref 30.00–100.00)

## 2023-03-01 LAB — TSH: TSH: 0.71 u[IU]/mL (ref 0.35–5.50)

## 2023-03-03 LAB — CYTOLOGY - PAP
Comment: NEGATIVE
Diagnosis: HIGH — AB
High risk HPV: POSITIVE — AB

## 2023-03-04 ENCOUNTER — Other Ambulatory Visit: Payer: Self-pay | Admitting: Internal Medicine

## 2023-03-04 DIAGNOSIS — R87613 High grade squamous intraepithelial lesion on cytologic smear of cervix (HGSIL): Secondary | ICD-10-CM | POA: Insufficient documentation

## 2023-03-04 DIAGNOSIS — R87810 Cervical high risk human papillomavirus (HPV) DNA test positive: Secondary | ICD-10-CM | POA: Insufficient documentation

## 2023-03-07 ENCOUNTER — Encounter: Payer: Self-pay | Admitting: Internal Medicine

## 2023-03-08 ENCOUNTER — Ambulatory Visit
Admission: RE | Admit: 2023-03-08 | Discharge: 2023-03-08 | Disposition: A | Discharge status: Home / Self Care | Payer: 59 | Source: Ambulatory Visit | Attending: Internal Medicine | Admitting: Internal Medicine

## 2023-03-08 DIAGNOSIS — R1032 Left lower quadrant pain: Secondary | ICD-10-CM | POA: Diagnosis not present

## 2023-03-08 DIAGNOSIS — K921 Melena: Secondary | ICD-10-CM

## 2023-03-08 MED ORDER — IOPAMIDOL (ISOVUE-300) INJECTION 61%
100.0000 mL | Freq: Once | INTRAVENOUS | Status: AC | PRN
  Administered 2023-03-08: 100 mL via INTRAVENOUS

## 2023-03-21 ENCOUNTER — Ambulatory Visit (INDEPENDENT_AMBULATORY_CARE_PROVIDER_SITE_OTHER): Payer: 59 | Admitting: Certified Nurse Midwife

## 2023-03-21 ENCOUNTER — Encounter (HOSPITAL_BASED_OUTPATIENT_CLINIC_OR_DEPARTMENT_OTHER): Payer: Self-pay | Admitting: Certified Nurse Midwife

## 2023-03-21 VITALS — BP 109/79 | HR 62 | Ht 68.0 in | Wt 143.0 lb

## 2023-03-21 DIAGNOSIS — R87613 High grade squamous intraepithelial lesion on cytologic smear of cervix (HGSIL): Secondary | ICD-10-CM | POA: Diagnosis not present
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
## 2023-03-22 ENCOUNTER — Encounter (HOSPITAL_BASED_OUTPATIENT_CLINIC_OR_DEPARTMENT_OTHER): Payer: Self-pay | Admitting: Certified Nurse Midwife

## 2023-03-22 DIAGNOSIS — R87613 High grade squamous intraepithelial lesion on cytologic smear of cervix (HGSIL): Secondary | ICD-10-CM | POA: Insufficient documentation

## 2023-04-01 ENCOUNTER — Ambulatory Visit
Admission: RE | Admit: 2023-04-01 | Discharge: 2023-04-01 | Disposition: A | Discharge status: Home / Self Care | Payer: 59 | Source: Ambulatory Visit | Attending: Family Medicine | Admitting: Family Medicine

## 2023-04-01 VITALS — BP 107/75 | HR 69 | Temp 97.6°F | Resp 17

## 2023-04-01 DIAGNOSIS — J029 Acute pharyngitis, unspecified: Secondary | ICD-10-CM | POA: Diagnosis not present

## 2023-04-01 NOTE — ED Triage Notes (Signed)
 Pt states she had a cold last week for about 5 days and she began to feel better. Last night she began to have swollen lymph nodes and chest pain and tightness.

## 2023-04-01 NOTE — Discharge Instructions (Signed)
 Over-the-counter medicines for symptomatic relief such as Motrin or Tylenol, salt water gargles, throat lozenges.  Follow-up for new, worsening symptoms or concerns, such as fever, inability to swallow
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
## 2023-04-15 ENCOUNTER — Other Ambulatory Visit (HOSPITAL_COMMUNITY)
Admission: RE | Admit: 2023-04-15 | Discharge: 2023-04-15 | Disposition: A | Discharge status: Home / Self Care | Payer: 59 | Source: Ambulatory Visit | Attending: Obstetrics & Gynecology | Admitting: Obstetrics & Gynecology

## 2023-04-15 ENCOUNTER — Ambulatory Visit (INDEPENDENT_AMBULATORY_CARE_PROVIDER_SITE_OTHER): Payer: 59 | Admitting: Obstetrics & Gynecology

## 2023-04-15 VITALS — BP 122/81 | HR 66 | Ht 68.0 in | Wt 139.0 lb

## 2023-04-15 DIAGNOSIS — B977 Papillomavirus as the cause of diseases classified elsewhere: Secondary | ICD-10-CM | POA: Insufficient documentation

## 2023-04-15 DIAGNOSIS — B009 Herpesviral infection, unspecified: Secondary | ICD-10-CM | POA: Diagnosis not present

## 2023-04-15 DIAGNOSIS — R87613 High grade squamous intraepithelial lesion on cytologic smear of cervix (HGSIL): Secondary | ICD-10-CM | POA: Insufficient documentation

## 2023-04-15 MED ORDER — VALACYCLOVIR HCL 500 MG PO TABS: ORAL_TABLET | ORAL | 1 refills | Status: AC

## 2023-04-15 NOTE — Progress Notes (Signed)
38 y.o. G0P0000 Single Not Hispanic or Latino female here for colposcopy with possible biopsies and/or ECC due to HGSIL with +HR HPV Pap obtained 02/28/2023 with PCP, Salvatore Decent, FNP.    Pt is accompanied by her mother.  Pap smear results discussed.  Treatment options reviewed.  She does want to proceed with LEEP today if appropriate.  Had not had a pap smear in 10 years.    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  Wants to know if procedure can still be done.  Does not have rx for valtrex.  Treatment for recurrent HSV discussed.  Will send rx to pharmacy..    Prior evaluation/treatment:  none.  Patient's last menstrual period was 03/12/2023.          Sexually active: not with female partner  Patient has been counseled about results and procedure.  Risks and benefits have bene reviewed including immediate and/or delayed bleeding, infection, cervical scaring from procedure, possibility of needing additional follow up as well as treatment.  Rare risks of missing a lesion discussed as well.  All questions answered.  Pt ready to proceed.  Consent obtained.  BP 122/81 (BP Location: Right Arm, Patient Position: Sitting, Cuff Size: Normal)   Pulse 66   Ht 5\' 8"  (1.727 m)   Wt 139 lb (63 kg)   LMP 03/12/2023   BMI 21.13 kg/m   General appearance: alert, cooperative and appears stated age Lymph nodes: No abnormal inguinal nodes palpated Neurologic: Grossly normal  Pelvic: External genitalia:  no lesions              Urethra:  normal appearing urethra with no masses, tenderness or lesions              Bartholins and Skenes: normal                 Vagina: normal appearing vagina with normal color and no discharge, no lesions   Anus:  two small healing vesicular lesions noted               Physical Exam Constitutional:      Appearance: Normal appearance.  Genitourinary:    Neurological:     General: No focal deficit present.     Mental Status: She is alert.  Psychiatric:         Mood and Affect: Mood normal.     Speculum placed.  3% acetic acid applied to cervix for >45 seconds.  Cervix visualized with both 7.5X and 15X magnification.  Green filter also used.  Lugols solution was not used.  Findings:  transition zone easy to see.  Small area of AWE at 3 o'clock and erythematous lesion at 5-6 o'clock noted.  Biopsy:  3 and 5 o'clock.  ECC:  was performed.  Monsel's was needed.  Excellent hemostasis was present.  Pt tolerated procedure well and all instruments were removed.  Findings noted above on picture of cervix.  Pt was uncomfortable with procedure.  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  Feel worth waiting for results before proceeding with LEEP  Chaperone, Donne Lo, CNM, was present during procedure.  Assessment/Plan: 1. HGSIL on cytologic smear of cervix (Primary) - Surgical pathology( [facility]/ [facility]) - Pathology results will be called to patient and follow-up planned pending results.  2. High risk HPV infection  3. HSV (herpes simplex virus) infection - rx for valtrex to pharmacy.  Dosing discussed with pt. - valACYclovir (VALTREX) 500 MG tablet; Take 1  tab BID x 3 days at symptom onset  Dispense: 30 tablet; Refill: 1

## 2023-04-19 LAB — SURGICAL PATHOLOGY

## 2023-05-06 ENCOUNTER — Other Ambulatory Visit (HOSPITAL_BASED_OUTPATIENT_CLINIC_OR_DEPARTMENT_OTHER): Payer: Self-pay | Admitting: Obstetrics & Gynecology

## 2023-05-06 DIAGNOSIS — R8789 Other abnormal findings in specimens from female genital organs: Secondary | ICD-10-CM

## 2023-05-06 DIAGNOSIS — D069 Carcinoma in situ of cervix, unspecified: Secondary | ICD-10-CM

## 2023-05-12 IMAGING — CR DG CHEST 2V
2 series · 2 of 2 positions shown · non-contrast
Comparison: 04/14/2017

CLINICAL DATA: Cough, otalgia for 1 month, fever

EXAM:
CHEST - 2 VIEW

[chest pa]
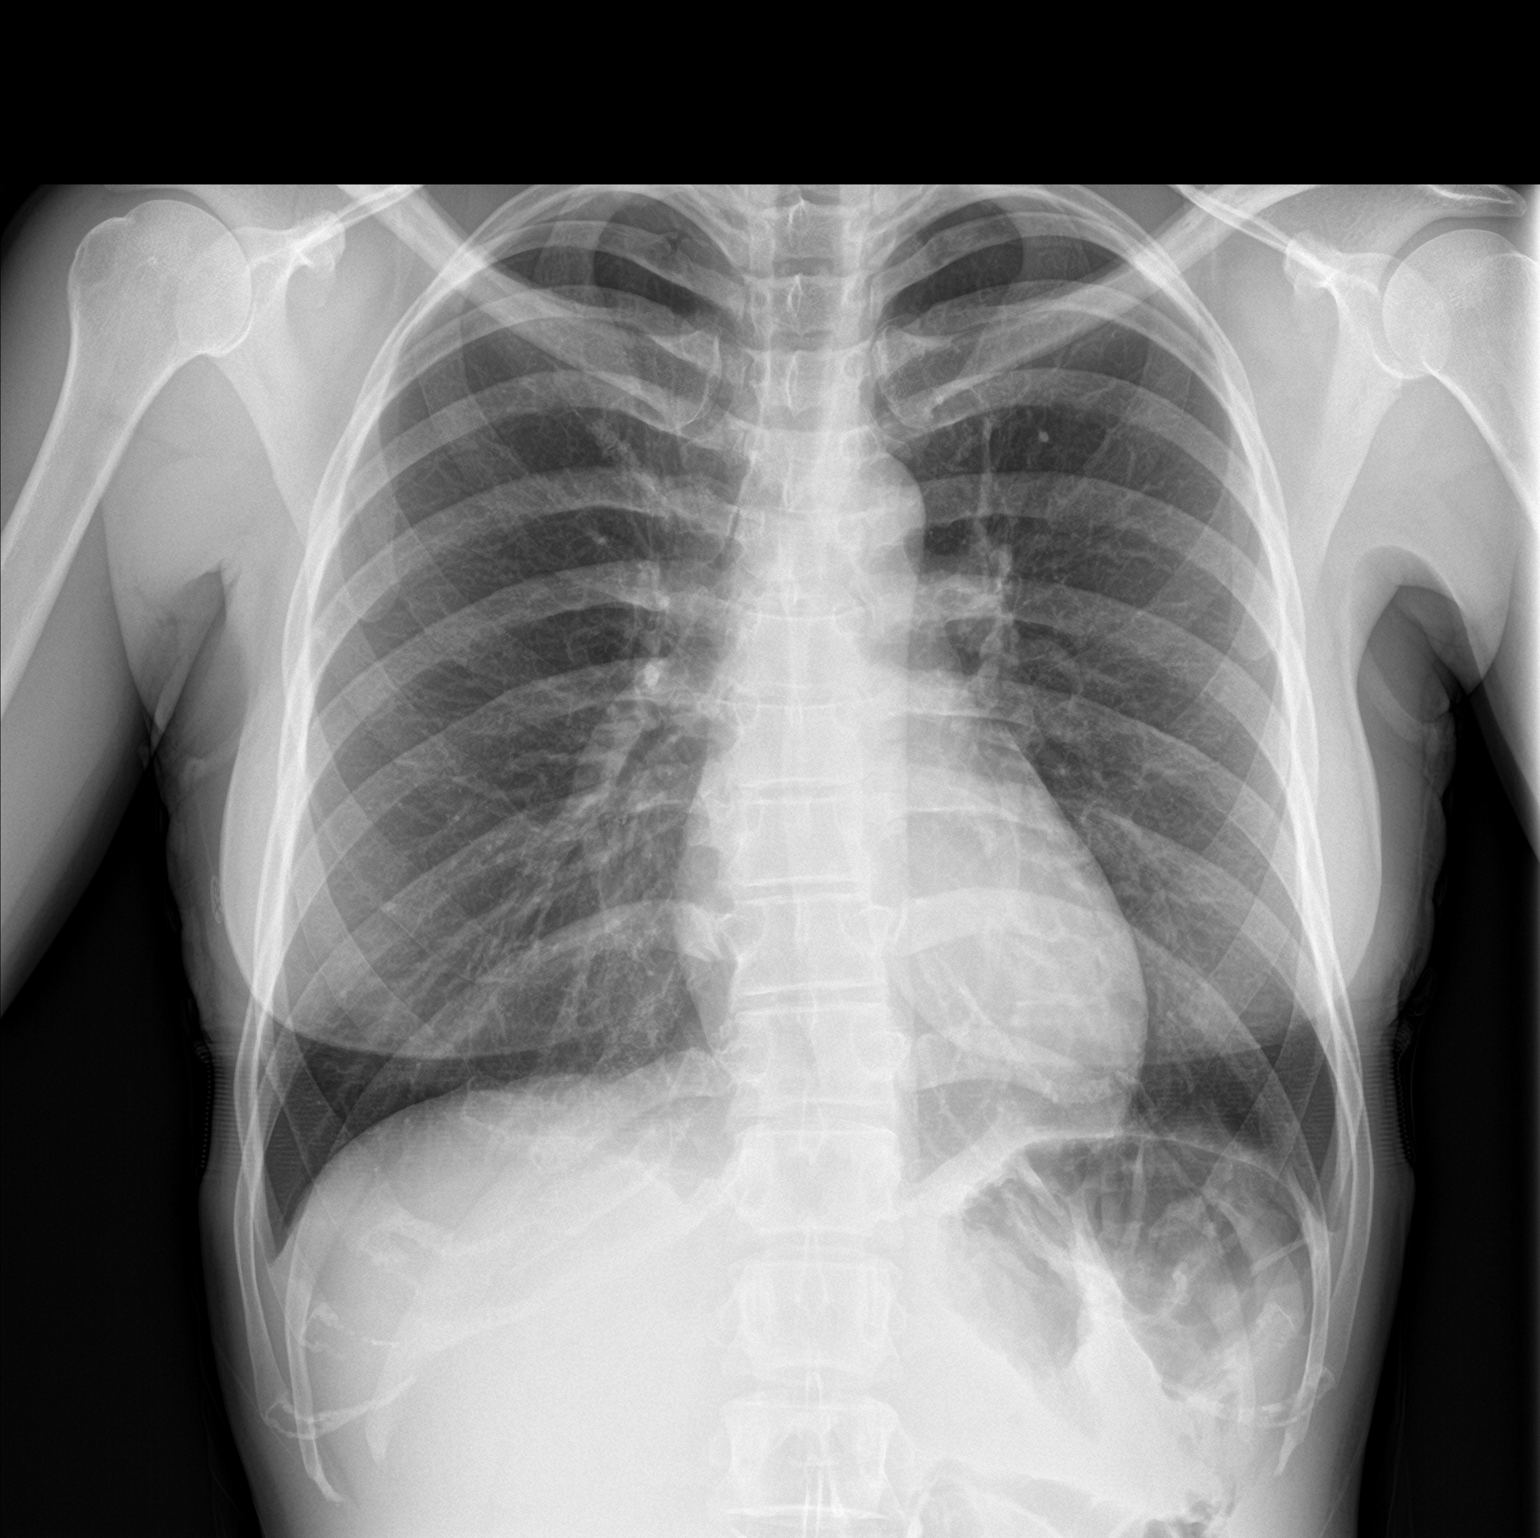

[chest lat]
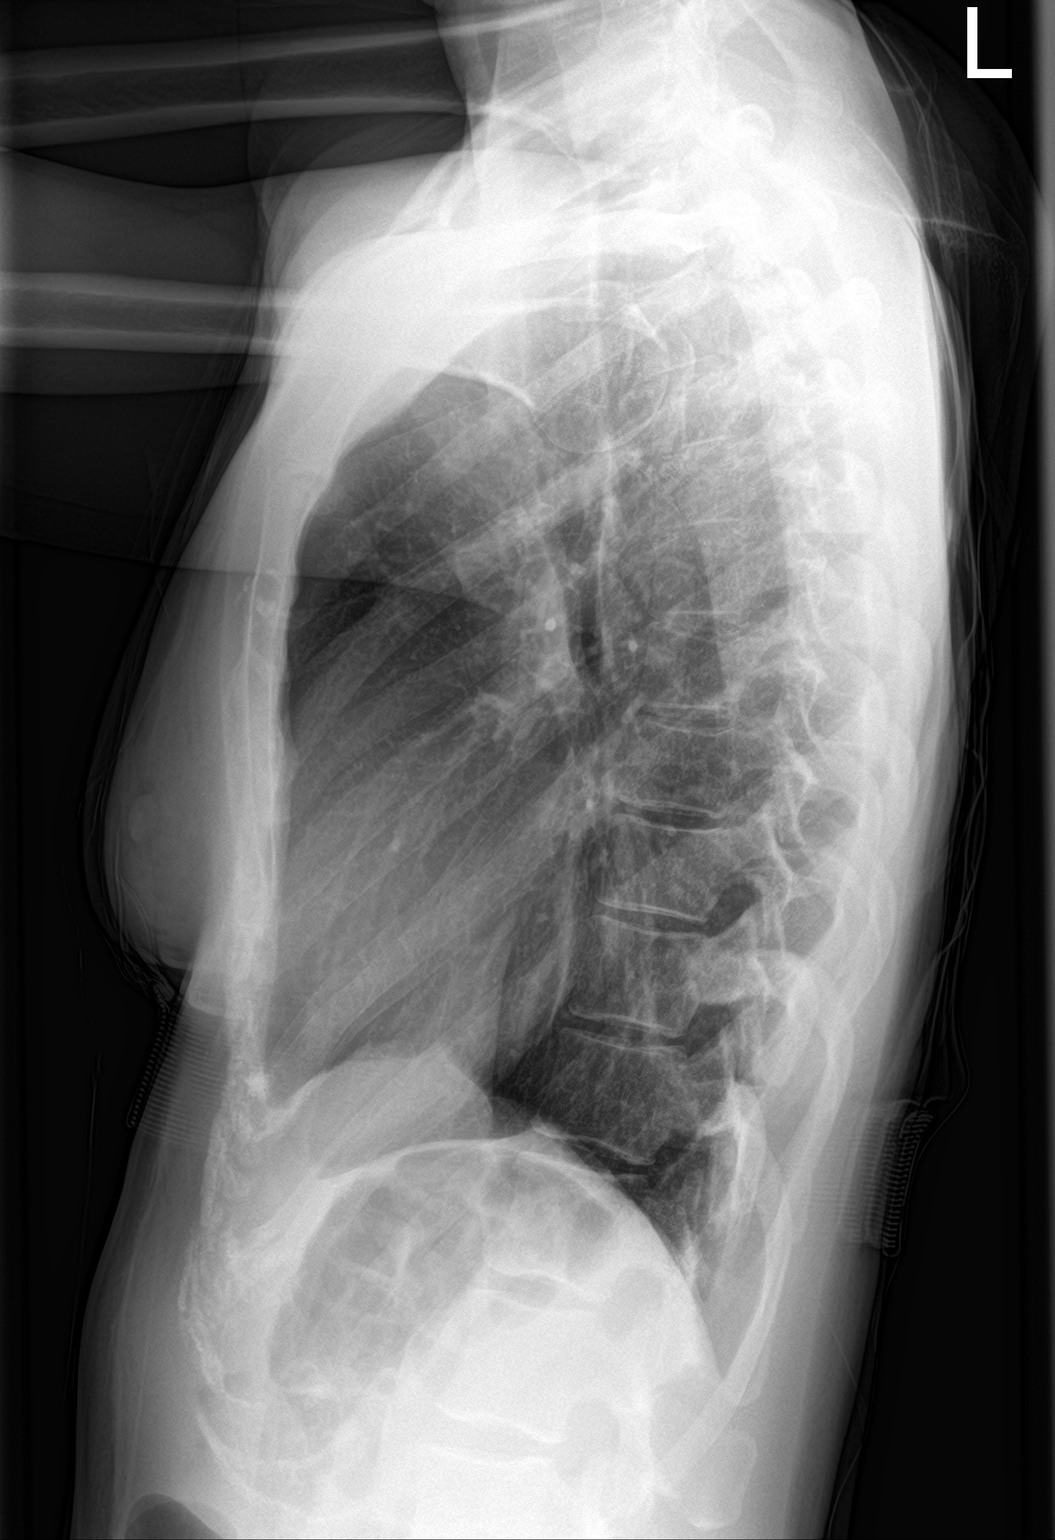

[2 of 2 positions shown; findings below may reference images not displayed]

FINDINGS: Frontal and lateral views of the chest demonstrate an unremarkable
cardiac silhouette. No acute airspace disease, effusion, or
pneumothorax. There are no acute bony abnormalities.
IMPRESSION: 1. No acute intrathoracic process.

## 2023-06-20 ENCOUNTER — Telehealth: Payer: Self-pay

## 2023-06-20 ENCOUNTER — Encounter (HOSPITAL_BASED_OUTPATIENT_CLINIC_OR_DEPARTMENT_OTHER): Payer: Self-pay

## 2023-06-20 NOTE — Telephone Encounter (Signed)
 Reached out to patient to schedule surgery w/ Dr. Hyacinth Meeker on 07/26/23 @[facility] at 11 am. I left a voicemail w/ details and asked patient to back to schedule at [phone].

## 2023-07-06 ENCOUNTER — Telehealth: Payer: Self-pay | Admitting: Obstetrics & Gynecology

## 2023-07-11 ENCOUNTER — Telehealth: Payer: Self-pay | Admitting: Obstetrics & Gynecology

## 2023-07-22 ENCOUNTER — Telehealth: Payer: Self-pay

## 2023-07-22 NOTE — Progress Notes (Signed)
 Several attempts made to contact patient for preop call. Contacted surgery scheduler to see if there was another phone number for patient. Surgery scheduler is going to try and contact patient.

## 2023-07-22 NOTE — Telephone Encounter (Signed)
 I reached out to patient to discuss surgery and verify insurance. Patient is scheduled on 07/26/23 w/ Dr. Annabell Key. I left a voicemail asking patient to call me. Advised the insurance on file has ended and patient would be responsible for all [phone]

## 2023-07-25 ENCOUNTER — Encounter (HOSPITAL_COMMUNITY): Payer: Self-pay | Admitting: Obstetrics & Gynecology

## 2023-07-25 NOTE — Progress Notes (Signed)
 Received call from patient.  Patient stated that she has surgery tomorrow but she is cancelling her surgery since she does not have insurance.  Pt verbalized understanding she has to speak to Dr Annabell Key office to cancel.  Gave patient OR scheduler phone number to call, Jonne Netters.

## 2023-07-26 ENCOUNTER — Encounter (HOSPITAL_BASED_OUTPATIENT_CLINIC_OR_DEPARTMENT_OTHER): Payer: Self-pay | Admitting: Certified Nurse Midwife

## 2023-07-26 ENCOUNTER — Ambulatory Visit (HOSPITAL_COMMUNITY): Admission: RE | Admit: 2023-07-26 | Payer: Self-pay | Source: Home / Self Care | Admitting: Obstetrics & Gynecology

## 2023-07-26 ENCOUNTER — Telehealth (HOSPITAL_BASED_OUTPATIENT_CLINIC_OR_DEPARTMENT_OTHER): Payer: Self-pay | Admitting: Obstetrics & Gynecology

## 2023-07-26 SURGERY — LEEP (LOOP ELECTROSURGICAL EXCISION PROCEDURE)
Anesthesia: Choice

## 2023-07-26 NOTE — Telephone Encounter (Signed)
 Called pt when OR nursing staff advised pt was not coming for surgery.  Pt did answer and declined having procedure done today due to insurance issues.  Importance of managing CIN 3 reviewed.  Pt states will reschedule as soon as she can.  Will send remind for 1 month to check with pt about rescheduling.  OR and scheduling notified.

## 2023-07-29 ENCOUNTER — Encounter (HOSPITAL_BASED_OUTPATIENT_CLINIC_OR_DEPARTMENT_OTHER): Payer: Self-pay | Admitting: Certified Nurse Midwife

## 2023-08-22 ENCOUNTER — Encounter (HOSPITAL_BASED_OUTPATIENT_CLINIC_OR_DEPARTMENT_OTHER): Payer: Self-pay | Admitting: Obstetrics & Gynecology

## 2023-08-24 ENCOUNTER — Telehealth: Payer: Self-pay

## 2023-08-24 NOTE — Telephone Encounter (Signed)
Attempted to contact patient regarding [employer]. Left message on voicemail requesting a return call.  

## 2023-09-20 ENCOUNTER — Telehealth: Payer: Self-pay

## 2023-09-20 NOTE — Telephone Encounter (Signed)
 Attempted to contact patient regarding [employer] [place] application, needs LEEP. Left message on voicemail requesting a return call.

## 2023-10-04 ENCOUNTER — Telehealth: Payer: Self-pay

## 2023-10-04 NOTE — Telephone Encounter (Signed)
 Attempted to contact patient regarding applying for [facility] Medicaid. Also, sent message via MyChart.

## 2023-10-04 NOTE — Telephone Encounter (Signed)
 Patient called back and completed [employer] [place] application.

## 2023-11-24 ENCOUNTER — Telehealth: Payer: Self-pay

## 2023-11-24 NOTE — Telephone Encounter (Signed)
 Patient did not return signed [employer] [place] form. Patient initially contacted [employer] requesting assistance to cover LEEP procedure. I completed application questions, but patient never returned forms. I made multiple calls, sent mychart messages, and mailed application form to be signed. Patient never returned calls or form.

## 2024-03-01 ENCOUNTER — Encounter: Payer: 59 | Admitting: Internal Medicine
